# Patient Record
Sex: Female | Born: 1990 | Race: White | Hispanic: No | Marital: Single | Smoking: Never smoker
Health system: Southern US, Community
[De-identification: ages and names within clinical notes are randomized; demographics above are authoritative.]

## PROBLEM LIST (undated history)

## (undated) DIAGNOSIS — R87610 Atypical squamous cells of undetermined significance on cytologic smear of cervix (ASC-US): Secondary | ICD-10-CM

## (undated) DIAGNOSIS — I10 Essential (primary) hypertension: Secondary | ICD-10-CM

## (undated) HISTORY — DX: Essential (primary) hypertension: I10

## (undated) HISTORY — PX: TONSILLECTOMY: SUR1361

## (undated) HISTORY — DX: Atypical squamous cells of undetermined significance on cytologic smear of cervix (ASC-US): R87.610

---
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
## 2011-01-06 IMAGING — CR DG THORACIC SPINE 2-3V
1 series · 2 of 2 positions shown · non-contrast
Comparison: none

REASON FOR EXAM: back pain
COMMENTS:

[Series 1: view not recorded · 0.17mm/px · 2 of 2 slices shown]
[im 1/2]
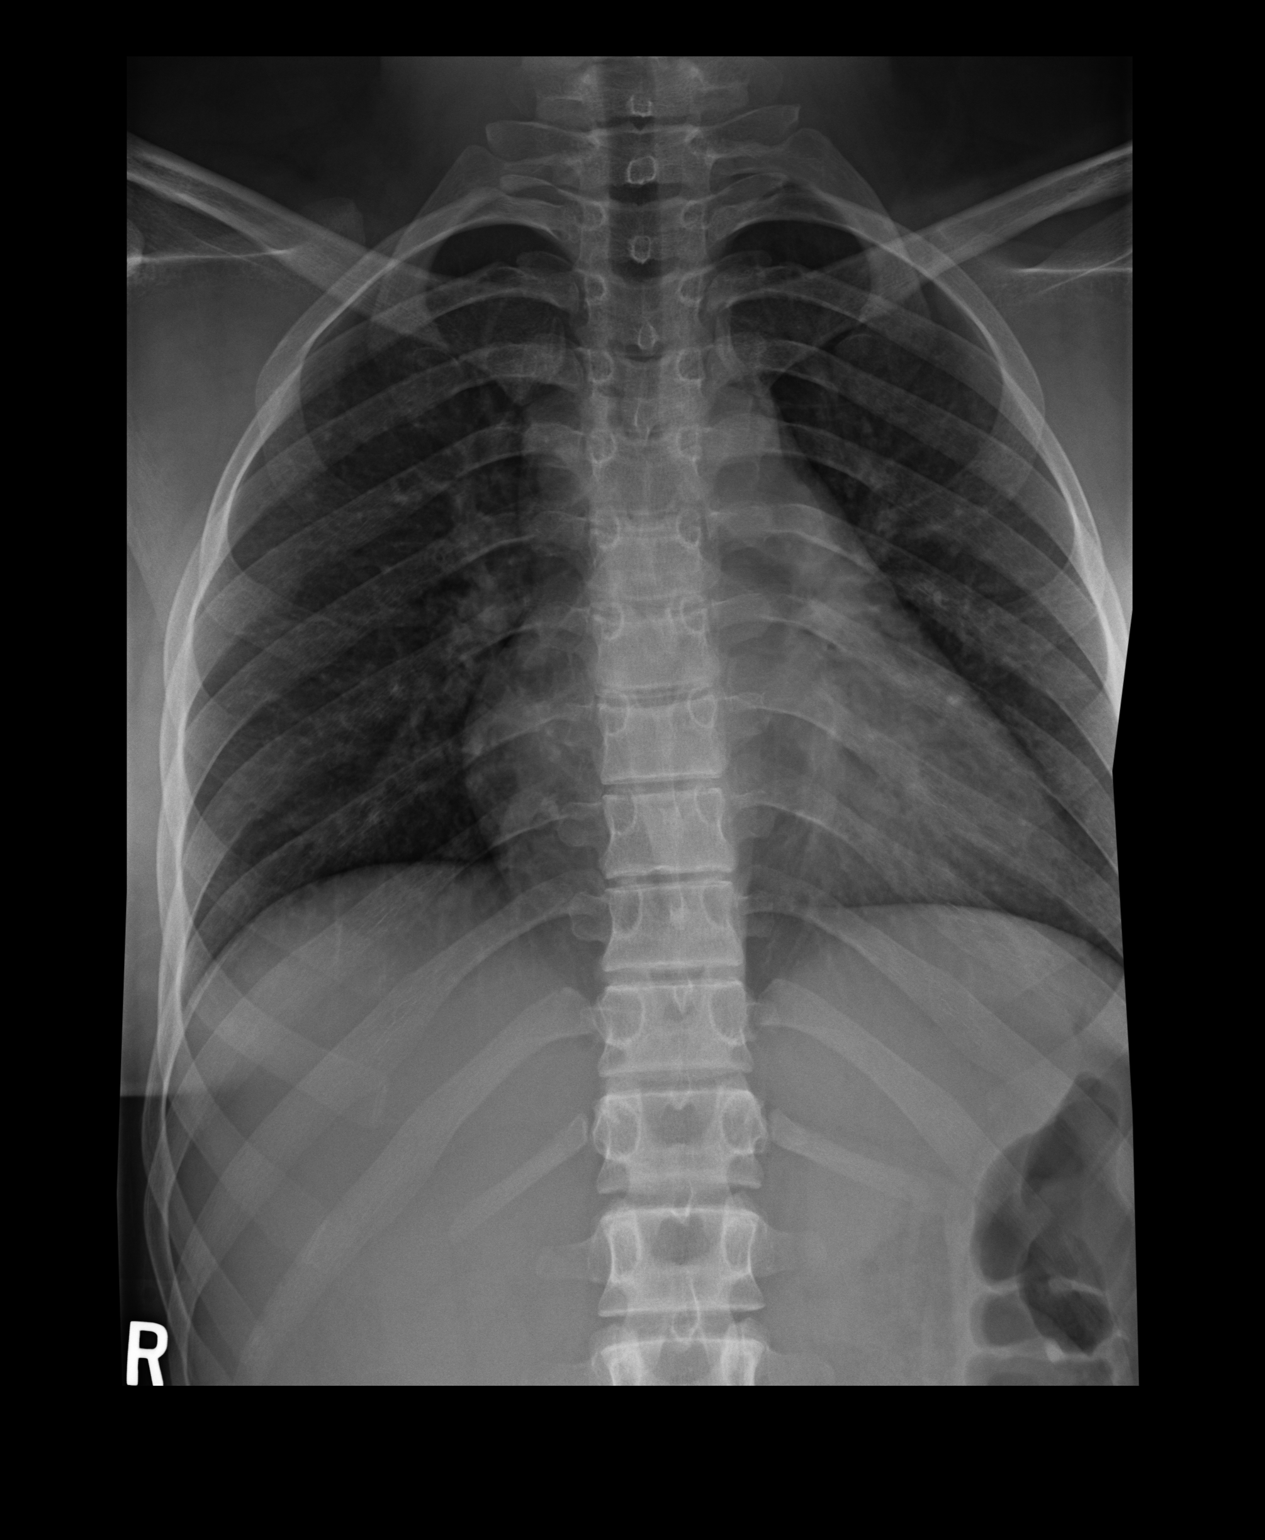
[im 2/2]
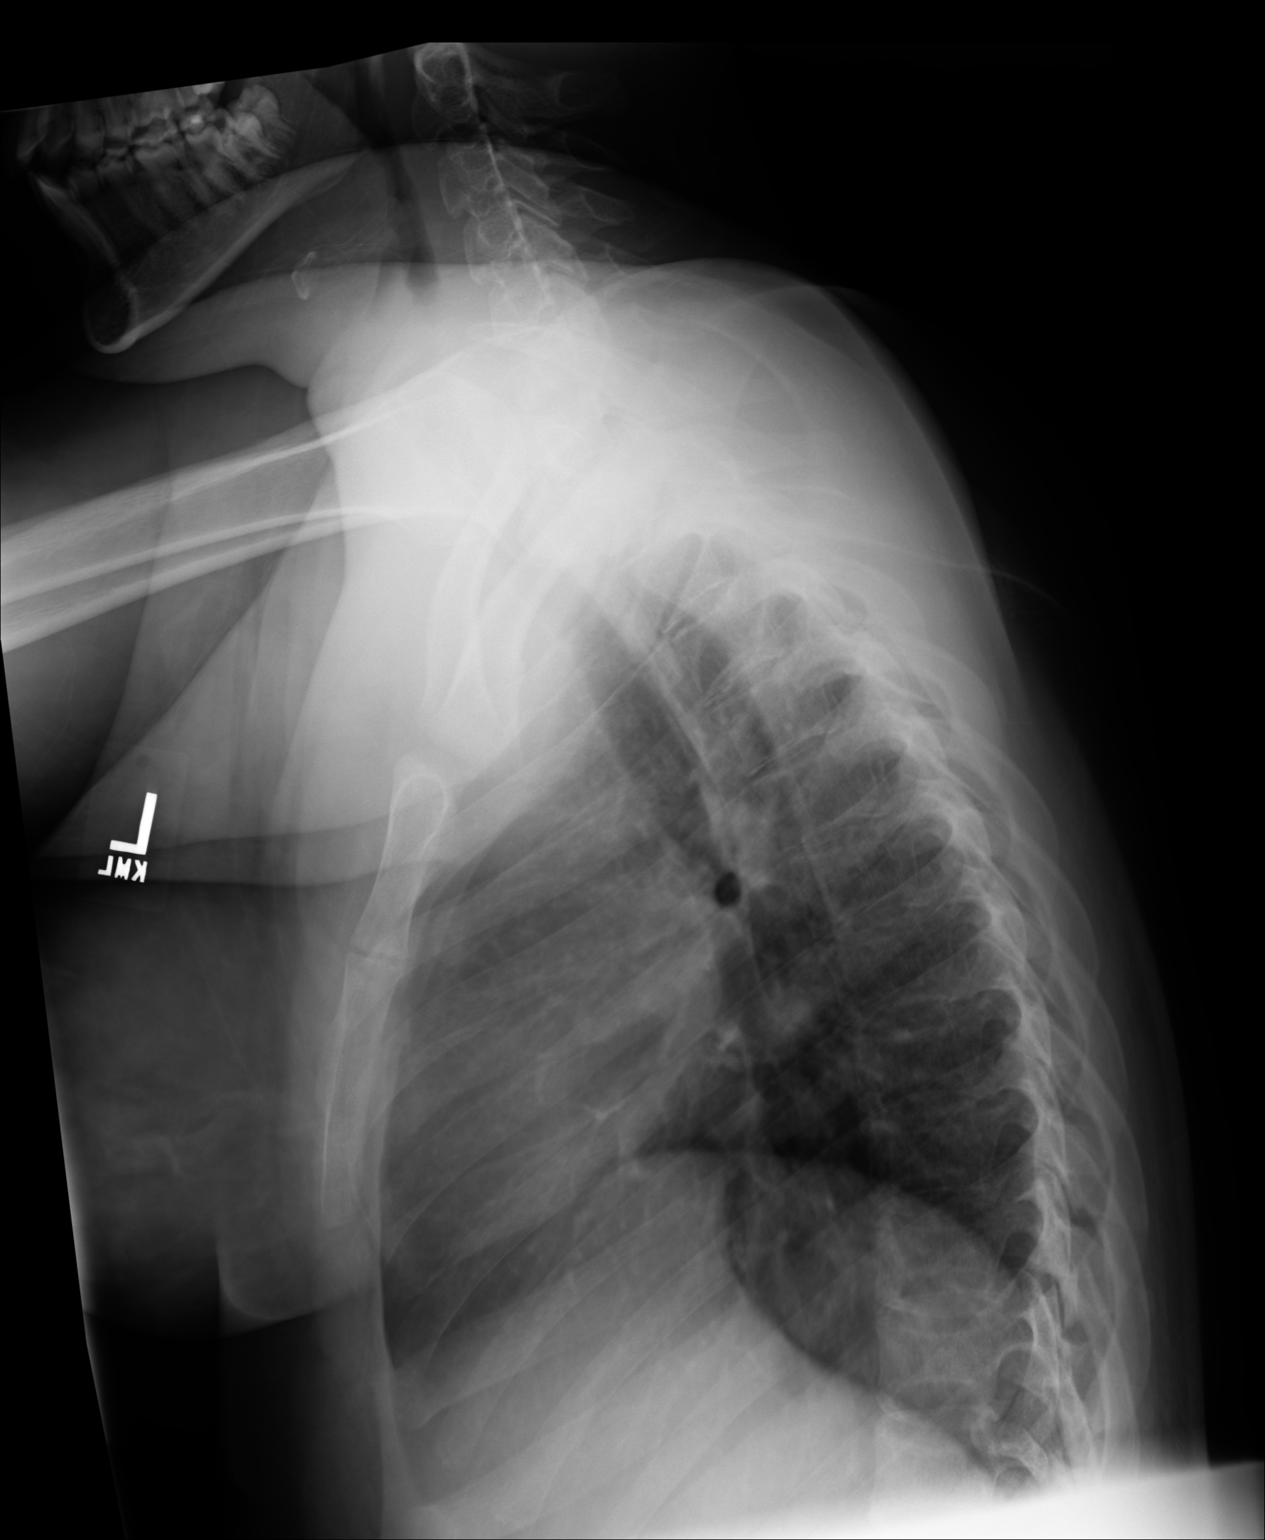

[2 of 2 positions shown; findings below may reference images not displayed]

PROCEDURE:     MDR - MDR THORACIC AP AND LATERAL  - November 16, 2008  [DATE]

RESULT:     Images of the thoracic spine show the vertebral body heights and
intervertebral disc spaces appear to be maintained. There is no scoliosis or
congenital abnormality evident. The included ribs appear unremarkable.
IMPRESSION: No acute thoracic spine bony abnormality.

## 2012-04-03 ENCOUNTER — Emergency Department: Payer: Self-pay | Admitting: Emergency Medicine

## 2014-08-17 DIAGNOSIS — R87610 Atypical squamous cells of undetermined significance on cytologic smear of cervix (ASC-US): Secondary | ICD-10-CM

## 2014-08-17 HISTORY — DX: Atypical squamous cells of undetermined significance on cytologic smear of cervix (ASC-US): R87.610

## 2015-06-19 HISTORY — PX: OTHER SURGICAL HISTORY: SHX169

## 2015-10-21 ENCOUNTER — Emergency Department: Payer: Worker's Compensation

## 2015-10-21 ENCOUNTER — Emergency Department
Admission: EM | Admit: 2015-10-21 | Discharge: 2015-10-21 | Disposition: A | Discharge status: Home / Self Care | Payer: Worker's Compensation | Attending: Student | Admitting: Student

## 2015-10-21 ENCOUNTER — Encounter: Payer: Self-pay | Admitting: Emergency Medicine

## 2015-10-21 DIAGNOSIS — Y9389 Activity, other specified: Secondary | ICD-10-CM | POA: Insufficient documentation

## 2015-10-21 DIAGNOSIS — X58XXXA Exposure to other specified factors, initial encounter: Secondary | ICD-10-CM | POA: Insufficient documentation

## 2015-10-21 DIAGNOSIS — S4992XA Unspecified injury of left shoulder and upper arm, initial encounter: Secondary | ICD-10-CM | POA: Diagnosis present

## 2015-10-21 DIAGNOSIS — Y9289 Other specified places as the place of occurrence of the external cause: Secondary | ICD-10-CM | POA: Insufficient documentation

## 2015-10-21 DIAGNOSIS — Y990 Civilian activity done for income or pay: Secondary | ICD-10-CM | POA: Insufficient documentation

## 2015-10-21 DIAGNOSIS — S46912A Strain of unspecified muscle, fascia and tendon at shoulder and upper arm level, left arm, initial encounter: Secondary | ICD-10-CM

## 2015-10-21 MED ORDER — IBUPROFEN 800 MG PO TABS: 800.0000 mg | ORAL_TABLET | Freq: Three times a day (TID) | ORAL | Status: AC | PRN

## 2015-10-21 MED ORDER — BACLOFEN 10 MG PO TABS: 10.0000 mg | ORAL_TABLET | Freq: Three times a day (TID) | ORAL | Status: DC

## 2015-10-21 NOTE — ED Notes (Signed)
[facility] and spoke with Donivan ScullAmy Allen. She confirmed that the patient was an employee and to only to perform a urine drug screen on her.

## 2015-10-21 NOTE — Discharge Instructions (Signed)
Follow-up with Dr. Martha ClanKrasinski as directed next week. Schedule appointment for possible continued workup including possible MRI at the discretion of the attending orthopedic provider.

## 2015-10-21 NOTE — ED Notes (Signed)
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

## 2015-10-21 NOTE — ED Provider Notes (Signed)
[facility] Emergency Department Provider Note  ____________________________________________  Time seen: Approximately 1:31 PM  I have reviewed the triage vital signs and the nursing notes.   HISTORY  Chief Complaint Shoulder Pain    HPI Elaine Fitzgerald is a 25 y.o. female presents for evaluation of left shoulder pain. 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 Reports similar incident about 3 years ago but has not any problems since. Here for evaluation. Describes her pain as 9/10 nonradiating and has not taken any medications at this time.   History reviewed. No pertinent past medical history.  There are no active problems to display for this patient.   Past Surgical History  Procedure Laterality Date  . Tonsillectomy      Current Outpatient Rx  Name  Route  Sig  Dispense  Refill  . baclofen (LIORESAL) 10 MG tablet   Oral   Take 1 tablet (10 mg total) by mouth 3 (three) times daily.   30 tablet   0   . ibuprofen (ADVIL,MOTRIN) 800 MG tablet   Oral   Take 1 tablet (800 mg total) by mouth every 8 (eight) hours as needed.   30 tablet   0     Allergies Vantin  No family history on file.  Social History Social History  Substance Use Topics  . Smoking status: Never Smoker   . Smokeless tobacco: None  . Alcohol Use: No    Review of Systems Constitutional: No fever/chills Cardiovascular: Denies chest pain. Respiratory: Denies shortness of breath. Musculoskeletal: Positive for left shoulder pain. Skin: Negative for rash. Neurological: Negative for headaches, focal weakness or numbness.  10-point ROS otherwise negative.  ____________________________________________   PHYSICAL EXAM:  VITAL SIGNS: ED Triage Vitals  Enc Vitals Group     BP 10/21/15 1253 158/102 mmHg     Pulse Rate 10/21/15 1253 72     Resp 10/21/15 1253 18     Temp 10/21/15 1253  98.6 F (37 C)     Temp Source 10/21/15 1253 Oral     SpO2 10/21/15 1253 98 %     Weight 10/21/15 1253 160 lb (72.576 kg)     Height 10/21/15 1253 5\' 1"  (1.549 m)     Head Cir --      Peak Flow --      Pain Score 10/21/15 1255 9     Pain Loc --      Pain Edu? --      Excl. in GC? --     Constitutional: Alert and oriented. Well appearing and in no acute distress. Neck: No stridor.   Cardiovascular: Normal rate, regular rhythm. Grossly normal heart sounds.  Good peripheral circulation. Respiratory: Normal respiratory effort.  No retractions. Lungs CTAB. Musculoskeletal: Left shoulder tenderness with limited range of motion. Increased pain with abduction and abduction as well as extension. To pronate and supinate without difficulty. Neurologic:  Normal speech and language. No gross focal neurologic deficits are appreciated. No gait instability. Distally neurovascularly intact. Strength 2+ equal bilateral. Skin:  Skin is warm, dry and intact. No rash noted. Psychiatric: Mood and affect are normal. Speech and behavior are normal.  ____________________________________________   LABS (all labs ordered are listed, but only abnormal results are displayed)  Labs Reviewed - No data to display   RADIOLOGY  Negative for any acute osseous findings. ____________________________________________   PROCEDURES  Procedure(s) performed: None  Critical Care performed: No  ____________________________________________   INITIAL IMPRESSION / ASSESSMENT AND PLAN / ED COURSE  Pertinent labs & imaging results that were available during my care of the patient were reviewed by me and considered in my medical decision making (see chart for details).  Acute left shoulder strain. Sling provided for comfort Rx given for Motrin 800 mg 3 times a day and Bactrim 10 mg 3 times a day. I'll give him to orthopedics to follow-up as needed for further evaluation and possible  MRI. ____________________________________________   FINAL CLINICAL IMPRESSION(S) / ED DIAGNOSES  Final diagnoses:  Left shoulder strain, initial encounter     This chart was dictated using voice recognition software/Dragon. Despite best efforts to proofread, errors can occur which can change the meaning. Any change was purely unintentional.   Evangeline Dakin, PA-C 10/21/15 1420  Gayla Doss, MD 10/21/15 [phone]

## 2015-10-21 NOTE — ED Notes (Signed)
Discussed discharge instructions, prescriptions, and follow-up care with patient. No questions or concerns at this time. Pt stable at discharge.  

## 2017-07-21 ENCOUNTER — Ambulatory Visit (INDEPENDENT_AMBULATORY_CARE_PROVIDER_SITE_OTHER): Payer: BLUE CROSS/BLUE SHIELD | Admitting: Obstetrics and Gynecology

## 2017-07-21 ENCOUNTER — Other Ambulatory Visit: Payer: Self-pay

## 2017-07-21 ENCOUNTER — Encounter: Payer: Self-pay | Admitting: Obstetrics and Gynecology

## 2017-07-21 DIAGNOSIS — Z124 Encounter for screening for malignant neoplasm of cervix: Secondary | ICD-10-CM

## 2017-07-21 DIAGNOSIS — Z1231 Encounter for screening mammogram for malignant neoplasm of breast: Secondary | ICD-10-CM

## 2017-07-21 DIAGNOSIS — Z01419 Encounter for gynecological examination (general) (routine) without abnormal findings: Secondary | ICD-10-CM | POA: Diagnosis not present

## 2017-07-21 DIAGNOSIS — Z1239 Encounter for other screening for malignant neoplasm of breast: Secondary | ICD-10-CM

## 2017-07-21 NOTE — Patient Instructions (Signed)
Preventive Care 18-39 Years, Female Preventive care refers to lifestyle choices and visits with your health care provider that can promote health and wellness. What does preventive care include?  A yearly physical exam. This is also called an annual well check.  Dental exams once or twice a year.  Routine eye exams. Ask your health care provider how often you should have your eyes checked.  Personal lifestyle choices, including: ? Daily care of your teeth and gums. ? Regular physical activity. ? Eating a healthy diet. ? Avoiding tobacco and drug use. ? Limiting alcohol use. ? Practicing safe sex. ? Taking vitamin and mineral supplements as recommended by your health care provider. What happens during an annual well check? The services and screenings done by your health care provider during your annual well check will depend on your age, overall health, lifestyle risk factors, and family history of disease. Counseling Your health care provider may ask you questions about your:  Alcohol use.  Tobacco use.  Drug use.  Emotional well-being.  Home and relationship well-being.  Sexual activity.  Eating habits.  Work and work Statistician.  Method of birth control.  Menstrual cycle.  Pregnancy history.  Screening You may have the following tests or measurements:  Height, weight, and BMI.  Diabetes screening. This is done by checking your blood sugar (glucose) after you have not eaten for a while (fasting).  Blood pressure.  Lipid and cholesterol levels. These may be checked every 5 years starting at age 66.  Skin check.  Hepatitis C blood test.  Hepatitis B blood test.  Sexually transmitted disease (STD) testing.  BRCA-related cancer screening. This may be done if you have a family history of breast, ovarian, tubal, or peritoneal cancers.  Pelvic exam and Pap test. This may be done every 3 years starting at age 40. Starting at age 59, this may be done every 5  years if you have a Pap test in combination with an HPV test.  Discuss your test results, treatment options, and if necessary, the need for more tests with your health care provider. Vaccines Your health care provider may recommend certain vaccines, such as:  Influenza vaccine. This is recommended every year.  Tetanus, diphtheria, and acellular pertussis (Tdap, Td) vaccine. You may need a Td booster every 10 years.  Varicella vaccine. You may need this if you have not been vaccinated.  HPV vaccine. If you are 69 or younger, you may need three doses over 6 months.  Measles, mumps, and rubella (MMR) vaccine. You may need at least one dose of MMR. You may also need a second dose.  Pneumococcal 13-valent conjugate (PCV13) vaccine. You may need this if you have certain conditions and were not previously vaccinated.  Pneumococcal polysaccharide (PPSV23) vaccine. You may need one or two doses if you smoke cigarettes or if you have certain conditions.  Meningococcal vaccine. One dose is recommended if you are age 27-21 years and a first-year college student living in a residence hall, or if you have one of several medical conditions. You may also need additional booster doses.  Hepatitis A vaccine. You may need this if you have certain conditions or if you travel or work in places where you may be exposed to hepatitis A.  Hepatitis B vaccine. You may need this if you have certain conditions or if you travel or work in places where you may be exposed to hepatitis B.  Haemophilus influenzae type b (Hib) vaccine. You may need this if  you have certain risk factors.  Talk to your health care provider about which screenings and vaccines you need and how often you need them. This information is not intended to replace advice given to you by your health care provider. Make sure you discuss any questions you have with your health care provider. Document Released: 10/01/2001 Document Revised: 04/24/2016  Document Reviewed: 06/06/2015 Elsevier Interactive Patient Education  2017 [employer].

## 2017-07-21 NOTE — Progress Notes (Signed)
Gynecology Annual Exam  PCP: Patient, No Pcp Per  Chief Complaint:  Chief Complaint  Patient presents with  . Gynecologic Exam    No Complaints    History of Present Illness: Patient is a 26 y.o. No obstetric history on file. presents for annual exam. The patient has no complaints today.   LMP: Patient's last menstrual period was 07/16/2017. Irregular on Nexplanon mostly amenorrhea  The patient is sexually active. She currently uses Nexplanon for contraception. She denies dyspareunia.  The patient does perform self breast exams.  There is no notable family history of breast or ovarian cancer in her family.  The patient wears seatbelts: yes.  The patient has regular exercise: not asked.    The patient denies current symptoms of depression.    Review of Systems: Review of Systems  Constitutional: Negative for chills and fever.  HENT: Negative for congestion.   Respiratory: Negative for cough and shortness of breath.   Cardiovascular: Negative for chest pain and palpitations.  Gastrointestinal: Negative for abdominal pain, constipation, diarrhea, heartburn, nausea and vomiting.  Genitourinary: Negative for dysuria, frequency and urgency.  Skin: Negative for itching and rash.  Neurological: Negative for dizziness and headaches.  Endo/Heme/Allergies: Negative for polydipsia.  Psychiatric/Behavioral: Negative for depression.    Past Medical History:  No past medical history on file.  Past Surgical History:  Past Surgical History:  Procedure Laterality Date  . TONSILLECTOMY      Gynecologic History:  Patient's last menstrual period was 07/16/2017. Contraception: 06/19/2015 Nexplanon Last Pap: Results were: 12/07/2015 [facility] HPV positive  Pap History 12/16/2007 NIL 06/14/2014 NIL 07/07/2014 NIL HPV positive 08/17/2014 ASC-H  HPV positive with colposcopy 08/17/2014 pap only no biopsies normal findings reported 11/29/2014 NIL HPV positive, 16 positive 02/28/2015  ASC-H, HPV positive 05/31/2015 [facility] HPV positive 09/05/2015 NIL HPV positive, 16 positive 12/07/2015 [facility] HPV positive  Obstetric History: No obstetric history on file.  Family History:  Family History  Problem Relation Age of Onset  . Hypertension Mother   . Hypertension Maternal Grandmother   . Heart disease Maternal Grandmother   . Cervical cancer Other     Social History:  Social History   Socioeconomic History  . Marital status: Single    Spouse name: Not on file  . Number of children: Not on file  . Years of education: Not on file  . Highest education level: Not on file  Social Needs  . Financial resource strain: Not on file  . Food insecurity - worry: Not on file  . Food insecurity - inability: Not on file  . Transportation needs - medical: Not on file  . Transportation needs - non-medical: Not on file  Occupational History  . Not on file  Tobacco Use  . Smoking status: Never Smoker  . Smokeless tobacco: Never Used  Substance and Sexual Activity  . Alcohol use: No  . Drug use: No  . Sexual activity: Yes    Partners: Male    Birth control/protection: Implant  Other Topics Concern  . Not on file  Social History Narrative  . Not on file    Allergies:  Allergies  Allergen Reactions  . Vantin [Cefpodoxime] Other (See Comments)    unknown    Medications: Prior to Admission medications   Medication Sig Start Date End Date Taking? Authorizing Provider  baclofen (LIORESAL) 10 MG tablet Take 1 tablet (10 mg total) by mouth 3 (three) times daily. 10/21/15   Beers, Charmayne Sheerharles M, PA-C  ibuprofen (  ADVIL,MOTRIN) 800 MG tablet Take 1 tablet (800 mg total) by mouth every 8 (eight) hours as needed. 10/21/15   Evangeline DakinBeers, Charles M, PA-C    Physical Exam Vitals: There were no vitals taken for this visit.  General: NAD HEENT: normocephalic, anicteric Thyroid: no enlargement, no palpable nodules Pulmonary: No increased work of breathing, CTAB Cardiovascular: RRR, distal  pulses 2+ Breast: Breast symmetrical, no tenderness, no palpable nodules or masses, no skin or nipple retraction present, no nipple discharge.  No axillary or supraclavicular lymphadenopathy. Abdomen: NABS, soft, non-tender, non-distended.  Umbilicus without lesions.  No hepatomegaly, splenomegaly or masses palpable. No evidence of hernia  Genitourinary:  External: Normal external female genitalia.  Normal urethral meatus, normal  Bartholin's and Skene's glands.    Vagina: Normal vaginal mucosa, no evidence of prolapse.    Cervix: Grossly normal in appearance, no bleeding  Uterus: Non-enlarged, mobile, normal contour.  No CMT  Adnexa: ovaries non-enlarged, no adnexal masses  Rectal: deferred  Lymphatic: no evidence of inguinal lymphadenopathy Extremities: no edema, erythema, or tenderness Neurologic: Grossly intact Psychiatric: mood appropriate, affect full  Female chaperone present for pelvic and breast  portions of the physical exam    Assessment: 26 y.o. No obstetric history on file. routine annual exam  Plan: Problem List Items Addressed This Visit    None    Visit Diagnoses    Screening for malignant neoplasm of cervix       Relevant Orders   Pap IG w/ reflex to HPV when ASC-U   Breast screening       Encounter for gynecological examination without abnormal finding       Relevant Orders   Pap IG w/ reflex to HPV when ASC-U      1) 4) Gardasil Series discussed and if applicable offered to patient - Patient has previously completed 3 shot series   2) STI screening was offered and declined   3) ASCCP guidelines and rational discussed.  Patient opts for yearly screening interval  4) Contraception - Continue nexplanon change around 06/18/2018   5) Follow up 1 year for routine annual exam

## 2017-07-23 LAB — PAP IG W/ RFLX HPV ASCU: PAP SMEAR COMMENT: 0

## 2017-07-30 ENCOUNTER — Telehealth: Payer: Self-pay | Admitting: Obstetrics and Gynecology

## 2017-07-30 NOTE — Telephone Encounter (Signed)
Pap abnormal, please call patient and discuss

## 2017-07-30 NOTE — Progress Notes (Signed)
Needs colposcopy in the next 2-4 weeks

## 2017-07-30 NOTE — Telephone Encounter (Signed)
Pt is calling about her labs results. Please advise °

## 2017-07-31 ENCOUNTER — Other Ambulatory Visit: Payer: Self-pay | Admitting: Obstetrics and Gynecology

## 2017-07-31 ENCOUNTER — Telehealth: Payer: Self-pay | Admitting: Obstetrics and Gynecology

## 2017-07-31 NOTE — Telephone Encounter (Signed)
Called and left voice mail for patient to call back to be schedule °

## 2017-07-31 NOTE — Telephone Encounter (Signed)
-----   Message from Vena AustriaAndreas Staebler, MD sent at 07/30/2017  5:36 PM EST ----- Regarding: Colposcopy Needs colposcopy in the next 2-4 weeks

## 2017-08-01 NOTE — Telephone Encounter (Signed)
Called and left voice mail for patient to call back to be schedule °

## 2017-08-28 ENCOUNTER — Ambulatory Visit: Payer: BLUE CROSS/BLUE SHIELD

## 2017-08-28 ENCOUNTER — Encounter: Payer: Self-pay | Admitting: Obstetrics and Gynecology

## 2017-08-28 ENCOUNTER — Ambulatory Visit (INDEPENDENT_AMBULATORY_CARE_PROVIDER_SITE_OTHER): Payer: BLUE CROSS/BLUE SHIELD | Admitting: Obstetrics and Gynecology

## 2017-08-28 VITALS — BP 128/67 | HR 89 | Wt 178.0 lb

## 2017-08-28 DIAGNOSIS — R87611 Atypical squamous cells cannot exclude high grade squamous intraepithelial lesion on cytologic smear of cervix (ASC-H): Secondary | ICD-10-CM | POA: Diagnosis not present

## 2017-08-28 NOTE — Progress Notes (Signed)
   GYNECOLOGY CLINIC COLPOSCOPY PROCEDURE NOTE  27 y.o. G1P0010 here for colposcopy for ASC-H pap smear on 07/21/2017. 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  Is the patient  pregnant: No LMP: No LMP recorded. Patient has had an implant. Smoking status:  Metrics: Intervention Frequency ACO  Documented Smoking Status Yearly  Screened one or more times in 24 months  Cessation Counseling or  Active cessation medication Past 24 months  Past 24 months   Guideline developer: UpToDate (See UpToDate for funding source) Date Released: 2014  Patient given informed consent, signed copy in the chart, time out was performed.  The patient was position in dorsal lithotomy position. Speculum was placed the cervix was visualized.   After application of acetic acid colposcopic inspection of the cervix was undertaken.   Colposcopy adequate, full visualization of transformation zone: Yes Minor acetowhite changes at 11 O'Clock, cervix retroverted; corresponding biopsies obtained.   ECC specimen obtained:  Yes  All specimens were labeled and sent to pathology.   Patient was given post procedure instructions.  Will follow up pathology and manage accordingly.  Routine preventative health maintenance measures emphasized.  OBGyn Exam  Vena AustriaAndreas Willene Holian, MD, Merlinda FrederickFACOG [facility], [facility]

## 2017-09-02 LAB — PATHOLOGY

## 2017-09-05 ENCOUNTER — Encounter: Payer: Self-pay | Admitting: Obstetrics and Gynecology

## 2017-11-27 ENCOUNTER — Ambulatory Visit (INDEPENDENT_AMBULATORY_CARE_PROVIDER_SITE_OTHER): Payer: BLUE CROSS/BLUE SHIELD | Admitting: Obstetrics and Gynecology

## 2017-11-27 ENCOUNTER — Encounter: Payer: Self-pay | Admitting: Obstetrics and Gynecology

## 2017-11-27 VITALS — BP 136/82 | HR 75 | Ht 61.0 in | Wt 179.0 lb

## 2017-11-27 DIAGNOSIS — R87611 Atypical squamous cells cannot exclude high grade squamous intraepithelial lesion on cytologic smear of cervix (ASC-H): Secondary | ICD-10-CM | POA: Diagnosis not present

## 2017-11-27 NOTE — Progress Notes (Signed)
   GYNECOLOGY CLINIC COLPOSCOPY PROCEDURE NOTE  27 y.o. G1P0010 here for colposcopy for ASC-H  pap smear on 07/21/2017. Biopsy at time of colposcopy showed scant atypical squamous epithelium but not enough tissue to rule out high grade dysplasia.  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  Is the patient  pregnant: No LMP: No LMP recorded. Patient has had an implant. Smoking status:  reports that she has never smoked. She has never used smokeless tobacco.   Patient given informed consent, signed copy in the chart, time out was performed.  The patient was position in dorsal lithotomy position. Speculum was placed the cervix was visualized.   After application of acetic acid colposcopic inspection of the cervix was undertaken.   Colposcopy adequate, full visualization of transformation zone: Yes no visible lesions; corresponding biopsies obtained at 12 O'Clock.   ECC specimen obtained:  Yes  All specimens were labeled and sent to pathology.   Patient was given post procedure instructions.  Will follow up pathology and manage accordingly.  Routine preventative health maintenance measures emphasized.  OBGyn Exam  Vena AustriaAndreas Imani Sherrin, MD, Merlinda FrederickFACOG [facility], [facility]

## 2017-12-01 LAB — PATHOLOGY

## 2017-12-02 ENCOUNTER — Encounter (INDEPENDENT_AMBULATORY_CARE_PROVIDER_SITE_OTHER): Payer: Self-pay

## 2017-12-10 IMAGING — CR DG SHOULDER 2+V*L*
1 series · 3 of 3 positions shown · non-contrast
Comparison: None

CLINICAL DATA: Healthcare worker, was turning patient at work and
felt shoulder pop, tenderness laterally LEFT shoulder with slight
limitation of range of motion, lifting injury at work, initial
encounter

EXAM:
LEFT SHOULDER - 2+ VIEW

[Series 1: dg shoulder left · 0.14mm/px · 3 of 3 slices shown]
[im 1/3]
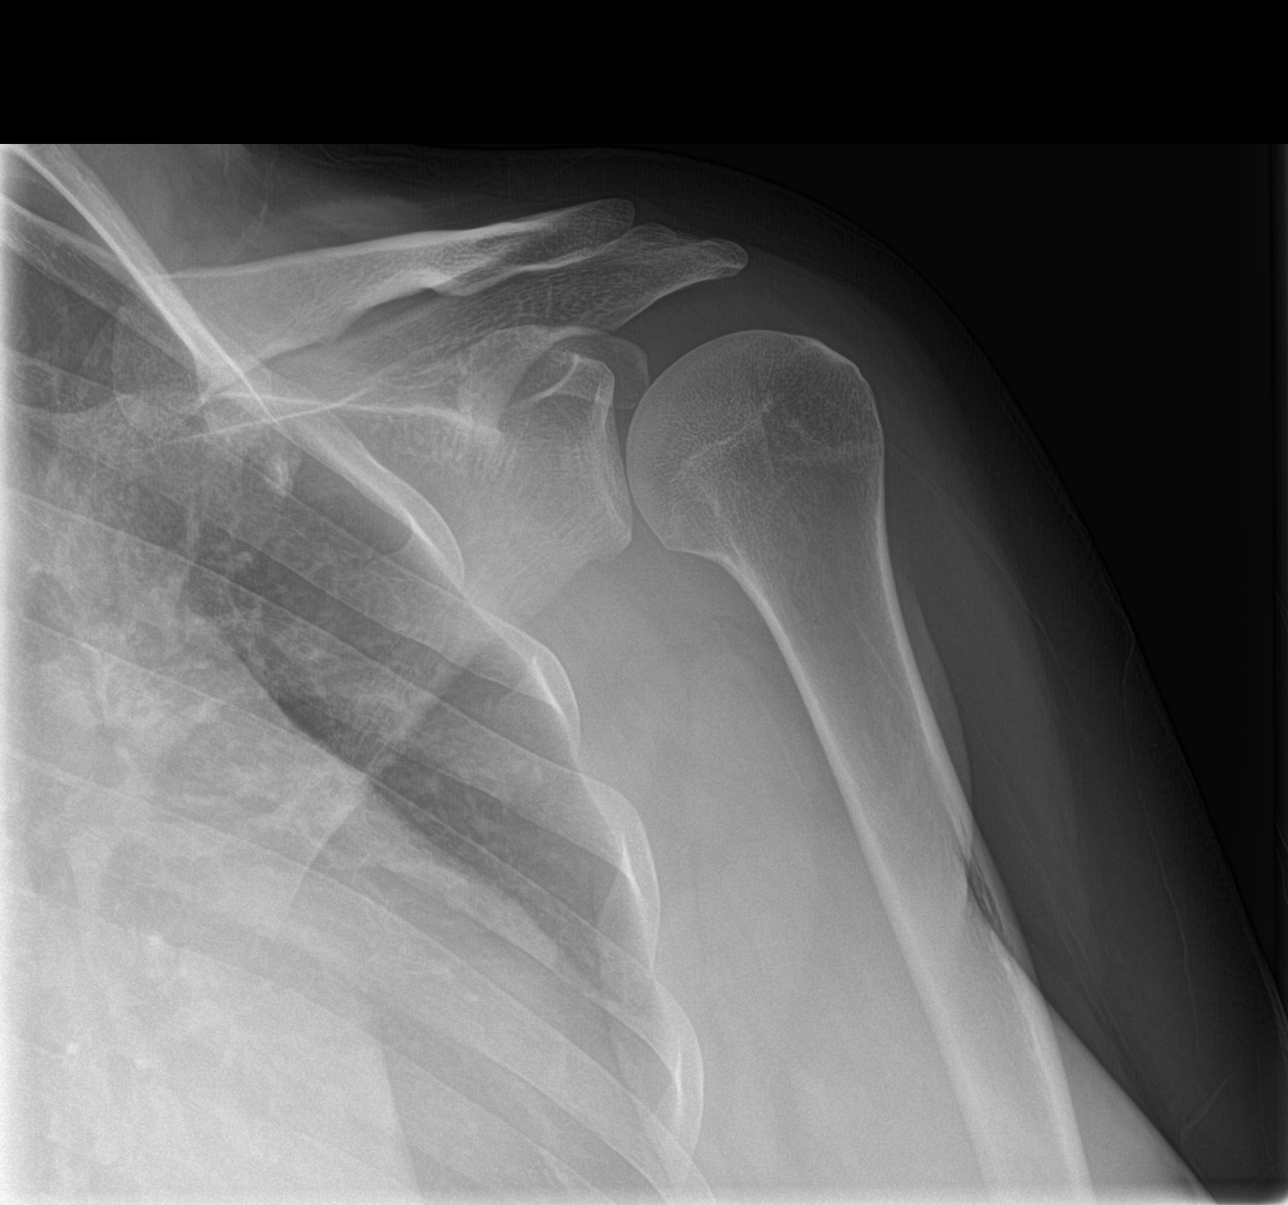
[im 2/3]
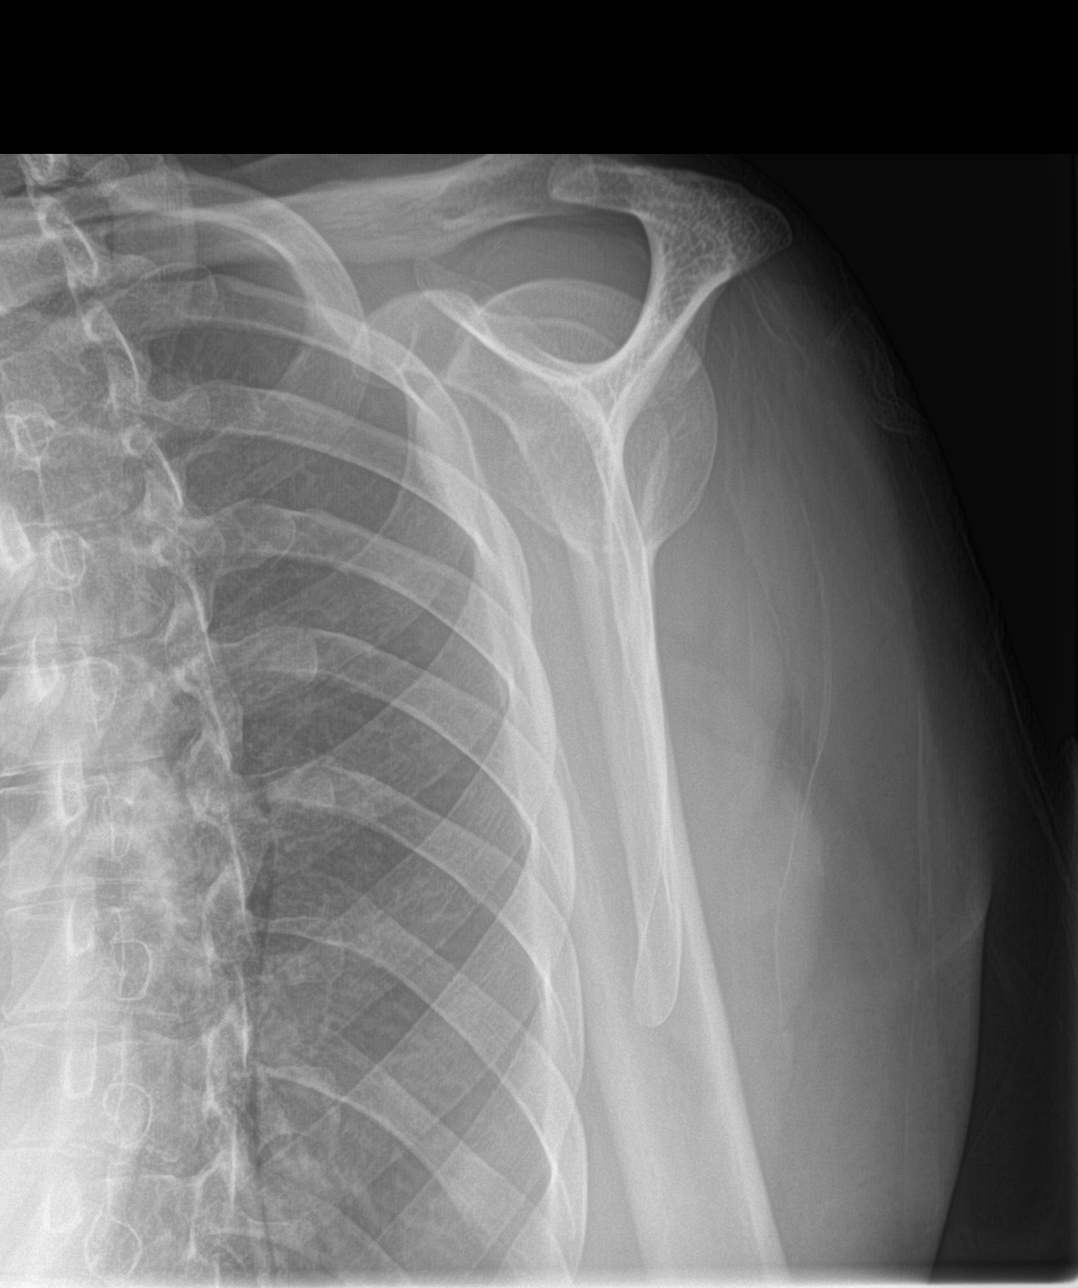
[im 3/3]
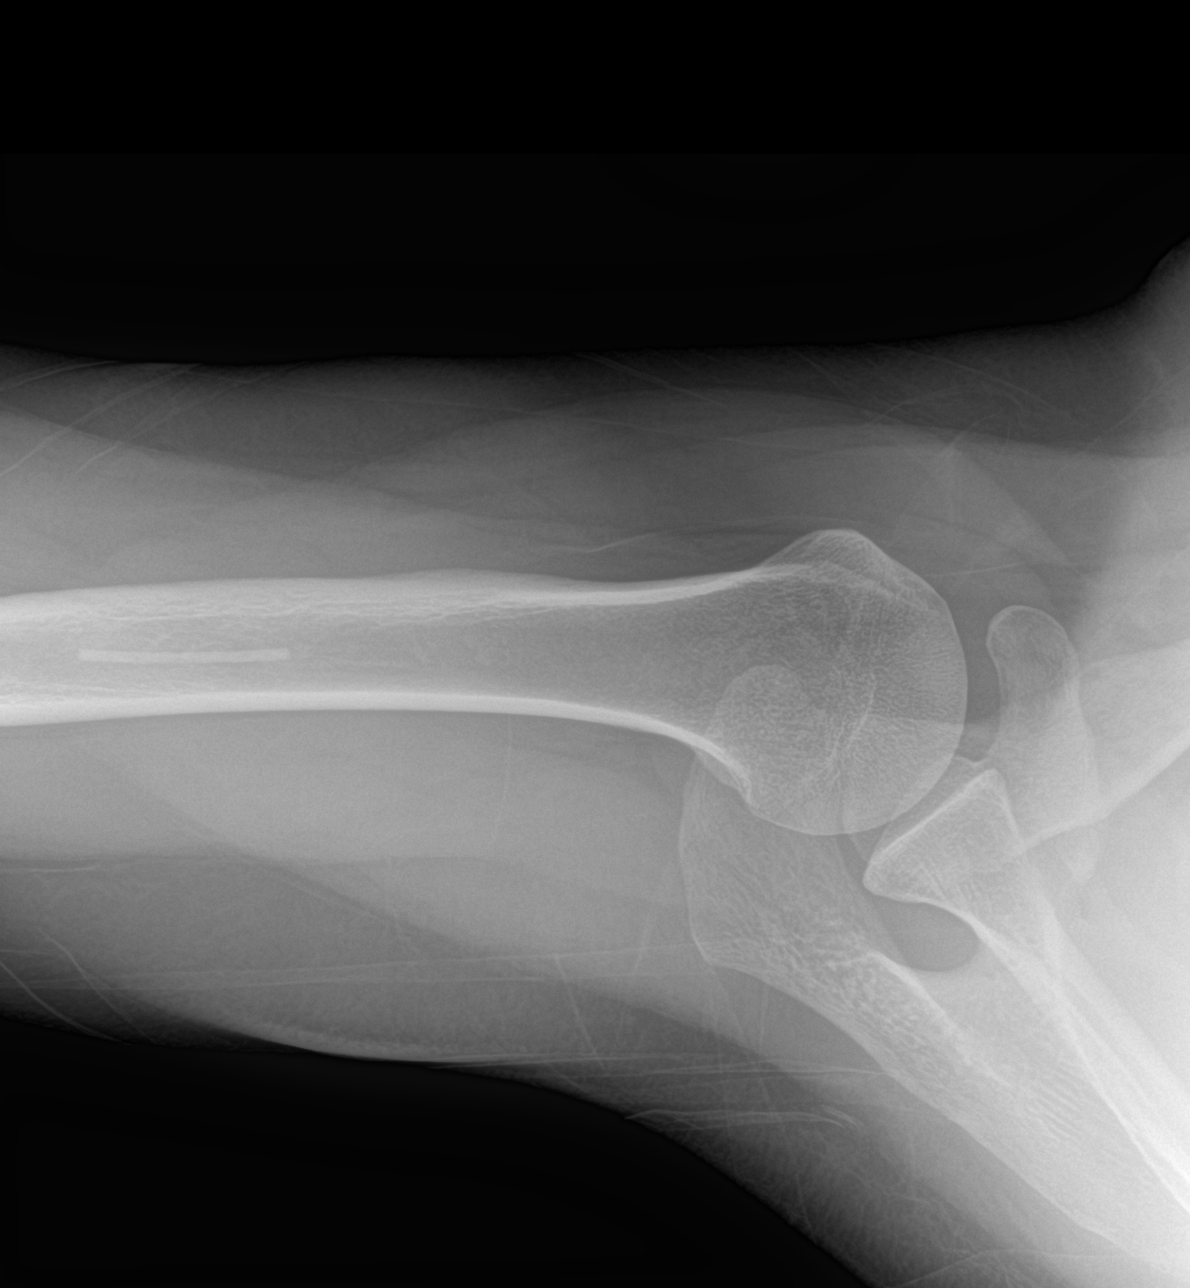

[3 of 3 positions shown; findings below may reference images not displayed]

FINDINGS: Osseous mineralization normal for technique.

AC joint alignment normal.

No acute fracture, dislocation or bone destruction.

Visualized LEFT ribs intact.
IMPRESSION: Normal exam.

## 2018-06-06 ENCOUNTER — Other Ambulatory Visit: Payer: Self-pay

## 2018-06-06 ENCOUNTER — Ambulatory Visit
Admission: EM | Admit: 2018-06-06 | Discharge: 2018-06-06 | Disposition: A | Discharge status: Home / Self Care | Payer: BLUE CROSS/BLUE SHIELD | Attending: Emergency Medicine | Admitting: Emergency Medicine

## 2018-06-06 DIAGNOSIS — J069 Acute upper respiratory infection, unspecified: Secondary | ICD-10-CM | POA: Diagnosis not present

## 2018-06-06 MED ORDER — BENZONATATE 200 MG PO CAPS: ORAL_CAPSULE | ORAL | 0 refills | Status: DC

## 2018-06-06 MED ORDER — CETIRIZINE-PSEUDOEPHEDRINE ER 5-120 MG PO TB12: 1.0000 | ORAL_TABLET | Freq: Every day | ORAL | 0 refills | Status: AC

## 2018-06-06 MED ORDER — AZITHROMYCIN 250 MG PO TABS: 250.0000 mg | ORAL_TABLET | Freq: Every day | ORAL | 0 refills | Status: DC

## 2018-06-06 MED ORDER — FLUTICASONE PROPIONATE 50 MCG/ACT NA SUSP: 2.0000 | Freq: Every day | NASAL | 0 refills | Status: AC

## 2018-06-06 MED ORDER — HYDROCOD POLST-CPM POLST ER 10-8 MG/5ML PO SUER: 5.0000 mL | Freq: Two times a day (BID) | ORAL | 0 refills | Status: DC

## 2018-06-06 NOTE — ED Triage Notes (Signed)
Pt with cough and chest tightness starting yesterday. Runny nose. Coughing up light green phlegm. Pain 7/10

## 2018-06-06 NOTE — Discharge Instructions (Signed)
Drink plenty of fluids.  Use Motrin or Tylenol for body aches or fever. Wear mask with during patient care.  Take the Z-Pak if you are still having your symptoms in 7 days

## 2018-06-06 NOTE — ED Provider Notes (Signed)
[facility]    CSN: [id number] Arrival date & time: 06/06/18  1338     History   Chief Complaint Chief Complaint  Patient presents with  . Cough    HPI Elaine Fitzgerald is a 27 y.o. female.   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  She is also had a runny nose.  She has had a cough that is been productive of yellow-green sputum.  She has pain over her chest when she coughs and feels congested.  Had no fever or chills.  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 No pertinent past medical history.  There are no active problems to display for this patient.   Past Surgical History:  Procedure Laterality Date  . TONSILLECTOMY      OB History    Gravida  1   Para      Term      Preterm      AB  1   Living  0     SAB      TAB  1   Ectopic      Multiple      Live Births               Home Medications    Prior to Admission medications   Medication Sig Start Date End Date Taking? Authorizing Provider  azithromycin (ZITHROMAX) 250 MG tablet Take 1 tablet (250 mg total) by mouth daily. Take first 2 tablets together, then 1 every day until finished. 06/06/18   Lutricia Feil, PA-C  benzonatate (TESSALON) 200 MG capsule Take one cap TID PRN cough 06/06/18   Ovid Curd P, PA-C  cetirizine-pseudoephedrine (ZYRTEC-D) 5-120 MG tablet Take 1 tablet by mouth daily. 06/06/18   Lutricia Feil, PA-C  chlorpheniramine-HYDROcodone (TUSSIONEX PENNKINETIC ER) 10-8 MG/5ML SUER Take 5 mLs by mouth 2 (two) times daily. 06/06/18   Lutricia Feil, PA-C  etonogestrel (NEXPLANON) 68 MG IMPL implant  06/15/15   [provider]  fluticasone (FLONASE) 50 MCG/ACT nasal spray Place 2 sprays into both nostrils daily. 06/06/18   Lutricia Feil, PA-C  ibuprofen (ADVIL,MOTRIN) 800 MG tablet Take 1 tablet (800 mg total) by mouth every 8 (eight) hours as needed.  10/21/15   Beers, Charmayne Sheer, PA-C    Family History Family History  Problem Relation Age of Onset  . Hypertension Mother   . Hypertension Maternal Grandmother   . Heart disease Maternal Grandmother   . Cervical cancer Other     Social History Social History   Tobacco Use  . Smoking status: Never Smoker  . Smokeless tobacco: Never Used  Substance Use Topics  . Alcohol use: Yes    Comment: occasional  . Drug use: No     Allergies   Vantin [cefpodoxime]   Review of Systems Review of Systems  Constitutional: Positive for activity change. Negative for chills, fatigue and fever.  HENT: Positive for congestion, postnasal drip and rhinorrhea.   Respiratory: Positive for cough and chest tightness.   All other systems reviewed and are negative.    Physical Exam Triage Vital Signs ED Triage Vitals  Enc Vitals Group     BP 06/06/18 1347 (!) 143/90     Pulse Rate 06/06/18 1347 72     Resp 06/06/18 1347 18     Temp 06/06/18 1347 99.2 F (37.3 C)  Temp Source 06/06/18 1347 Oral     SpO2 06/06/18 1347 94 %     Weight 06/06/18 1346 183 lb (83 kg)     Height 06/06/18 1346 5\' 1"  (1.549 m)     Head Circumference --      Peak Flow --      Pain Score 06/06/18 1346 7     Pain Loc --      Pain Edu? --      Excl. in GC? --    No data found.  Updated Vital Signs BP (!) 143/90 (BP Location: Right Arm)   Pulse 72   Temp 99.2 F (37.3 C) (Oral)   Resp 18   Ht 5\' 1"  (1.549 m)   Wt 183 lb (83 kg)   LMP 05/16/2018   SpO2 100%   BMI 34.58 kg/m   Visual Acuity Right Eye Distance:   Left Eye Distance:   Bilateral Distance:    Right Eye Near:   Left Eye Near:    Bilateral Near:     Physical Exam  Constitutional: She is oriented to person, place, and time. She appears well-developed and well-nourished. No distress.  HENT:  Head: Normocephalic.  Right Ear: External ear normal.  Left Ear: External ear normal.  Nose: Nose normal.  Mouth/Throat: Oropharynx is clear  and moist. No oropharyngeal exudate.  Eyes: Pupils are equal, round, and reactive to light. Right eye exhibits no discharge. Left eye exhibits no discharge.  Neck: Normal range of motion.  Pulmonary/Chest: Effort normal and breath sounds normal.  Musculoskeletal: Normal range of motion.  Neurological: She is alert and oriented to person, place, and time.  Skin: Skin is warm and dry. She is not diaphoretic.  Psychiatric: She has a normal mood and affect. Her behavior is normal. Judgment and thought content normal.  Nursing note and vitals reviewed.    UC Treatments / Results  Labs (all labs ordered are listed, but only abnormal results are displayed) Labs Reviewed - No data to display  EKG None  Radiology No results found.  Procedures Procedures (including critical care time)  Medications Ordered in UC Medications - No data to display  Initial Impression / Assessment and Plan / UC Course  I have reviewed the triage vital signs and the nursing notes.  Pertinent labs & imaging results that were available during my care of the patient were reviewed by me and considered in my medical decision making (see chart for details).     I advised the patient this is likely a viral illness and does not require antibiotics at this time.  Treat her with a cough suppressants.  I have also advised her to use Flonase and Zyrtec-D for her nasal congestion.  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  Told her if she starts taking it now it is likely that she would not get any benefit from it but if the symptoms continue she may. Final Clinical Impressions(s) / UC Diagnoses   Final diagnoses:  Upper respiratory tract infection, unspecified type     Discharge Instructions     Drink plenty of fluids.  Use Motrin or Tylenol for body aches or fever. Wear mask with during patient care.  Take the Z-Pak if you are still having your  symptoms in 7 days    ED Prescriptions    Medication Sig Dispense Auth. Provider   benzonatate (TESSALON) 200 MG capsule  Take one cap TID PRN cough 30 capsule Lutricia Feil, PA-C   chlorpheniramine-HYDROcodone (TUSSIONEX PENNKINETIC ER) 10-8 MG/5ML SUER Take 5 mLs by mouth 2 (two) times daily. 115 mL Ovid Curd P, PA-C   fluticasone (FLONASE) 50 MCG/ACT nasal spray Place 2 sprays into both nostrils daily. 16 g Ovid Curd P, PA-C   azithromycin (ZITHROMAX) 250 MG tablet Take 1 tablet (250 mg total) by mouth daily. Take first 2 tablets together, then 1 every day until finished. 6 tablet Lutricia Feil, PA-C   cetirizine-pseudoephedrine (ZYRTEC-D) 5-120 MG tablet Take 1 tablet by mouth daily. 30 tablet Lutricia Feil, PA-C     Controlled Substance Prescriptions [place] Controlled Substance Registry consulted? Not Applicable   Lutricia Feil, PA-C 06/06/18 1426

## 2018-07-28 ENCOUNTER — Encounter: Payer: Self-pay | Admitting: Obstetrics and Gynecology

## 2018-07-30 NOTE — Progress Notes (Signed)
Gynecology Annual Exam  PCP: [facility]  Chief Complaint:  Chief Complaint  Patient presents with  . Gynecologic Exam    low abdominal cramping  . Vaginitis    History of Present Illness: Patient is a 27 y.o. G1P0010 presents for annual exam. The patient has no complaints today.   LMP: Patient's last menstrual period was 07/22/2018 (exact date). Absent secondary to nexplanon  The patient is sexually active. She currently uses Nexplanon for contraception. She denies dyspareunia.  There is no notable family history of breast or ovarian cancer in her family.  The patient wears seatbelts: yes.  The patient has regular exercise: not asked.    The patient denies current symptoms of depression.    Review of Systems: Review of Systems  Constitutional: Negative for chills and fever.  HENT: Negative for congestion.   Respiratory: Negative for cough and shortness of breath.   Cardiovascular: Negative for chest pain and palpitations.  Gastrointestinal: Negative for abdominal pain, constipation, diarrhea, heartburn, nausea and vomiting.  Genitourinary: Negative for dysuria, frequency and urgency.  Skin: Negative for itching and rash.  Neurological: Negative for dizziness and headaches.  Endo/Heme/Allergies: Negative for polydipsia.  Psychiatric/Behavioral: Negative for depression.    Past Medical History:  Past Medical History:  Diagnosis Date  . Atypical squamous cell changes of undetermined significance (ASCUS) on cervical cytology with negative high risk human papilloma virus (HPV) test result 08/17/2014  . Essential hypertension     Past Surgical History:  Past Surgical History:  Procedure Laterality Date  . Nexplanon  06/19/2015  . TONSILLECTOMY      Gynecologic History:  Patient's last menstrual period was 07/22/2018 (exact date). Contraception: 06/19/2015 Nexplanon Last Pap: Results were:  07/21/2017 ASC cannot exclude high grade lesion [facility])    08/28/2017 Colposcopy could not exclude high grade lesion based on P16 staining pattern 11/27/2017 Negative ectocervical biopsy and ECC  Obstetric History: G1P0010  Family History:  Family History  Problem Relation Age of Onset  . Hypertension Mother   . Hypertension Maternal Grandmother   . Heart disease Maternal Grandmother   . Cervical cancer Other     Social History:  Social History   Socioeconomic History  . Marital status: Single    Spouse name: Not on file  . Number of children: Not on file  . Years of education: Not on file  . Highest education level: Not on file  Occupational History  . Not on file  Social Needs  . Financial resource strain: Not on file  . Food insecurity:    Worry: Not on file    Inability: Not on file  . Transportation needs:    Medical: Not on file    Non-medical: Not on file  Tobacco Use  . Smoking status: Never Smoker  . Smokeless tobacco: Never Used  Substance and Sexual Activity  . Alcohol use: Yes    Comment: occasional  . Drug use: No  . Sexual activity: Yes    Partners: Male    Birth control/protection: Implant  Lifestyle  . Physical activity:    Days per week: 0 days    Minutes per session: Not on file  . Stress: Not at all  Relationships  . Social connections:    Talks on phone: More than three times a week    Gets together: More than three times a week    Attends religious service: Never    Active member of club or organization: No  Attends meetings of clubs or organizations: Never    Relationship status: Never married  . Intimate partner violence:    Fear of current or ex partner: No    Emotionally abused: No    Physically abused: No    Forced sexual activity: No  Other Topics Concern  . Not on file  Social History Narrative  . Not on file    Allergies:  Allergies  Allergen Reactions  . Vantin [Cefpodoxime] Other (See Comments)    unknown    Medications: Prior to Admission medications   Medication  Sig Start Date End Date Taking? Authorizing Provider  azithromycin (ZITHROMAX) 250 MG tablet Take 1 tablet (250 mg total) by mouth daily. Take first 2 tablets together, then 1 every day until finished. 06/06/18   Lutricia Feil, PA-C  benzonatate (TESSALON) 200 MG capsule Take one cap TID PRN cough 06/06/18   Ovid Curd P, PA-C  cetirizine-pseudoephedrine (ZYRTEC-D) 5-120 MG tablet Take 1 tablet by mouth daily. 06/06/18   Lutricia Feil, PA-C  chlorpheniramine-HYDROcodone (TUSSIONEX PENNKINETIC ER) 10-8 MG/5ML SUER Take 5 mLs by mouth 2 (two) times daily. 06/06/18   Lutricia Feil, PA-C  etonogestrel (NEXPLANON) 68 MG IMPL implant  06/15/15   [provider]  fluticasone (FLONASE) 50 MCG/ACT nasal spray Place 2 sprays into both nostrils daily. 06/06/18   Lutricia Feil, PA-C  ibuprofen (ADVIL,MOTRIN) 800 MG tablet Take 1 tablet (800 mg total) by mouth every 8 (eight) hours as needed. 10/21/15   Beers, Charmayne Sheer, PA-C    Physical Exam Vitals: Blood pressure 132/90, pulse 74, height 5\' 1"  (1.549 m), weight 178 lb (80.7 kg), last menstrual period 07/22/2018.   General: NAD HEENT: normocephalic, anicteric Thyroid: no enlargement, no palpable nodules Pulmonary: No increased work of breathing, CTAB Cardiovascular: RRR, distal pulses 2+ Breast: Breast symmetrical, no tenderness, no palpable nodules or masses, no skin or nipple retraction present, no nipple discharge.  No axillary or supraclavicular lymphadenopathy. Abdomen: NABS, soft, non-tender, non-distended.  Umbilicus without lesions.  No hepatomegaly, splenomegaly or masses palpable. No evidence of hernia  Genitourinary:  External: Normal external female genitalia.  Normal urethral meatus, normal Bartholin's and Skene's glands.    Vagina: Normal vaginal mucosa, no evidence of prolapse.    Cervix: Grossly normal in appearance, no bleeding  Uterus: Non-enlarged, mobile, normal contour.  No CMT  Adnexa: ovaries  non-enlarged, no adnexal masses  Rectal: deferred  Lymphatic: no evidence of inguinal lymphadenopathy Extremities: no edema, erythema, or tenderness Neurologic: Grossly intact Psychiatric: mood appropriate, affect full  Female chaperone present for pelvic and breast  portions of the physical exam      Assessment: 27 y.o. G1P0010 routine annual exam  Plan: Problem List Items Addressed This Visit    None    Visit Diagnoses    Encounter for gynecological examination without abnormal finding    -  Primary   Breast screening       Screening for malignant neoplasm of cervix       Relevant Orders   Cytology - PAP      1) 4) Gardasil Series discussed and if applicable offered to patient - Patient has previously completed 3 shot series   2) STI screening  was notoffered and therefore not obtained  3)  ASCCP guidelines and rational discussed.  Repeat pap today per ASCCP guidlines  4) Contraception - the patient is currently using  Nexplanon.  She is happy with her current form of contraception and plans to  continue We discussed safe sex practices to reduce her furture risk of STI's.   - plan on replacing in the next few months  5) Return in about 6 months (around 01/30/2019) for nexplanon removal and reinsertion.   Vena AustriaAndreas Nicosha Struve, MD, Evern CoreFACOG [facility], [facility] 07/30/2018, 8:27 PM

## 2018-07-31 ENCOUNTER — Ambulatory Visit (INDEPENDENT_AMBULATORY_CARE_PROVIDER_SITE_OTHER): Payer: BLUE CROSS/BLUE SHIELD | Admitting: Obstetrics and Gynecology

## 2018-07-31 ENCOUNTER — Other Ambulatory Visit (HOSPITAL_COMMUNITY)
Admission: RE | Admit: 2018-07-31 | Discharge: 2018-07-31 | Disposition: A | Discharge status: Home / Self Care | Payer: BLUE CROSS/BLUE SHIELD | Source: Ambulatory Visit | Attending: Obstetrics and Gynecology | Admitting: Obstetrics and Gynecology

## 2018-07-31 ENCOUNTER — Telehealth: Payer: Self-pay | Admitting: Obstetrics and Gynecology

## 2018-07-31 ENCOUNTER — Encounter: Payer: Self-pay | Admitting: Obstetrics and Gynecology

## 2018-07-31 VITALS — BP 132/90 | HR 74 | Ht 61.0 in | Wt 178.0 lb

## 2018-07-31 DIAGNOSIS — Z01419 Encounter for gynecological examination (general) (routine) without abnormal findings: Secondary | ICD-10-CM | POA: Diagnosis not present

## 2018-07-31 DIAGNOSIS — Z124 Encounter for screening for malignant neoplasm of cervix: Secondary | ICD-10-CM | POA: Insufficient documentation

## 2018-07-31 DIAGNOSIS — Z1239 Encounter for other screening for malignant neoplasm of breast: Secondary | ICD-10-CM

## 2018-07-31 NOTE — Telephone Encounter (Signed)
6/17 at 910 with ams for nexplanon

## 2018-07-31 NOTE — Patient Instructions (Signed)
RePhresh Pro-B 

## 2018-08-03 ENCOUNTER — Telehealth: Payer: Self-pay

## 2018-08-03 NOTE — Telephone Encounter (Signed)
Pt calling for yeast inf results.   [phone]  Left detailed msg that results were not back yet.  It usually takes about 10 business days.  She can call back or if abnl we will call her. (I only saw where a pap was done)

## 2018-08-05 LAB — CYTOLOGY - PAP: Diagnosis: NEGATIVE

## 2018-08-07 ENCOUNTER — Telehealth: Payer: Self-pay

## 2018-08-07 NOTE — Telephone Encounter (Signed)
The only thing that was sent was a pap

## 2018-08-07 NOTE — Telephone Encounter (Signed)
Pt calling again for results from Fri.  [phone]  Called pt to tell her AMS sent msg in MyChart that pap is negative.  Pt is looking for results of swab she thought AMS obtained and sent off.  States her cramping is getting.  Was culture done?

## 2018-08-07 NOTE — Telephone Encounter (Signed)
Pt aware.

## 2018-08-07 NOTE — Telephone Encounter (Signed)
Typically there is no other swab when a pap is done unless it is a wet prep. AMS please advise

## 2019-02-01 NOTE — Telephone Encounter (Signed)
Patient is reschedule to 02/16/19 with AMS

## 2019-02-03 ENCOUNTER — Ambulatory Visit: Payer: BLUE CROSS/BLUE SHIELD | Admitting: Obstetrics and Gynecology

## 2019-02-16 ENCOUNTER — Ambulatory Visit (INDEPENDENT_AMBULATORY_CARE_PROVIDER_SITE_OTHER): Payer: BC Managed Care – PPO | Admitting: Obstetrics and Gynecology

## 2019-02-16 ENCOUNTER — Other Ambulatory Visit: Payer: Self-pay

## 2019-02-16 ENCOUNTER — Encounter: Payer: Self-pay | Admitting: Obstetrics and Gynecology

## 2019-02-16 VITALS — BP 126/70 | HR 74 | Wt 164.0 lb

## 2019-02-16 DIAGNOSIS — Z30017 Encounter for initial prescription of implantable subdermal contraceptive: Secondary | ICD-10-CM

## 2019-02-16 DIAGNOSIS — Z3049 Encounter for surveillance of other contraceptives: Secondary | ICD-10-CM | POA: Diagnosis not present

## 2019-02-16 DIAGNOSIS — Z3046 Encounter for surveillance of implantable subdermal contraceptive: Secondary | ICD-10-CM

## 2019-02-16 NOTE — Progress Notes (Signed)
   GYNECOLOGY PROCEDURE NOTE  Implanon removal discussed in detail.  Risks of infection, bleeding, nerve injury all reviewed.  Patient understands risks and desires to proceed.  Verbal consent obtained.   She understands that Nexplanon is a progesterone only therapy, and that patients often patients have irregular and unpredictable vaginal bleeding or amenorrhea. She understands that other side effects are possible related to systemic progesterone, including but not limited to, headaches, breast tenderness, nausea, and irritability. While effective at preventing pregnancy long acting reversible contraceptives do not prevent transmission of sexually transmitted diseases and use of barrier methods for this purpose was discussed. The placement procedure for Nexplanon was reviewed with the patient in detail including risks of nerve injury, infection, bleeding and injury to other muscles or tendons. She understands that the Nexplanon implant is good for 3 years and needs to be removed at the end of that time.  She understands that Nexplanon is an extremely effective option for contraception, with failure rate of <1%. This information is reviewed today and all questions were answered. Informed consent was obtained, both verbally and written.   The patient is healthy and has no contraindications to Implanon use.  All questions answered.  Procedure: Patient placed in dorsal supine with left arm above head, elbow flexed at 90 degrees, arm resting on examination table.  Implanon identified without problems.  Betadine scrub x3.  1 ml of 1% lidocaine injected under implanon device without problems.  Sterile gloves applied.  Small 0.5cm incision made at distal tip of implanon device with 11 blade scalpel.  Implanon brought to incision and grasped with a small kelly clamp.  Implanon removed intact without problems.   Nexplanon removed form sterile blister packaging,  Device confirmed in needle, before inserting full  length of needle, tenting up the skin as the needle was advance.  The drug eluting rod was then deployed by pulling back the slider per the manufactures recommendation.  The implant was palpable by the clinician as well as the patient.  The insertion site covered dressed with a band aid before applying  a kerlex bandage pressure dressing..Minimal blood loss was noted during the procedure.  The patientt tolerated the procedure well.   She was instructed to wear the bandage for 24 hours, call with any signs of infection.  She was given the Implanon card and instructed to have the rod removed in 3 years.

## 2019-05-10 ENCOUNTER — Telehealth: Payer: Self-pay

## 2019-05-10 NOTE — Telephone Encounter (Signed)
Made in error

## 2019-05-10 NOTE — Telephone Encounter (Signed)
Pt called to ask some questions about her BC. I called pt but no answer LVM to return my call.

## 2019-05-10 NOTE — Telephone Encounter (Signed)
Pt called back stated she has has irregular bleeding sense she started on this new nexplanon. Pt had a regular period in July, no period in August and now she has been bleeding all of September. I have advised pt this can happen for the first 6 months or so until your body starts to regulate. Pt stated this did not happen with first nexplanon and she is unsure of why this is happening now. Pt would AMS to advise on why this would be happening and for her to called about this. Please advise, Thank you!

## 2019-05-10 NOTE — Telephone Encounter (Signed)
Did not reach patient left voicemail that we could a) continue to monitor to see if bleeding profile improves b) do a 1 week course of premarin c) have patient make visit for removal and discuss altneratives

## 2020-09-20 NOTE — Telephone Encounter (Signed)
Nexplanon rcvd/charged 02/16/2019
